# Patient Record
Sex: Male | Born: 1960
Health system: Southern US, Community
[De-identification: ages and names within clinical notes are randomized; demographics above are authoritative.]

## PROBLEM LIST (undated history)

## (undated) DIAGNOSIS — E079 Disorder of thyroid, unspecified: Secondary | ICD-10-CM

## (undated) DIAGNOSIS — R7309 Other abnormal glucose: Secondary | ICD-10-CM

---

## 2014-08-09 DIAGNOSIS — I10 Essential (primary) hypertension: Secondary | ICD-10-CM | POA: Insufficient documentation

## 2014-08-09 DIAGNOSIS — E559 Vitamin D deficiency, unspecified: Secondary | ICD-10-CM | POA: Insufficient documentation

## 2016-08-19 DIAGNOSIS — J301 Allergic rhinitis due to pollen: Secondary | ICD-10-CM | POA: Insufficient documentation

## 2017-05-28 DIAGNOSIS — M722 Plantar fascial fibromatosis: Secondary | ICD-10-CM | POA: Insufficient documentation

## 2018-11-07 ENCOUNTER — Encounter: Payer: Self-pay | Admitting: Emergency Medicine

## 2018-11-07 ENCOUNTER — Ambulatory Visit (INDEPENDENT_AMBULATORY_CARE_PROVIDER_SITE_OTHER): Payer: BLUE CROSS/BLUE SHIELD

## 2018-11-07 ENCOUNTER — Other Ambulatory Visit: Payer: Self-pay

## 2018-11-07 ENCOUNTER — Ambulatory Visit
Admission: EM | Admit: 2018-11-07 | Discharge: 2018-11-07 | Disposition: A | Discharge status: Home / Self Care | Payer: BLUE CROSS/BLUE SHIELD | Attending: Family Medicine | Admitting: Family Medicine

## 2018-11-07 DIAGNOSIS — S92355A Nondisplaced fracture of fifth metatarsal bone, left foot, initial encounter for closed fracture: Secondary | ICD-10-CM | POA: Diagnosis not present

## 2018-11-07 DIAGNOSIS — W010XXA Fall on same level from slipping, tripping and stumbling without subsequent striking against object, initial encounter: Secondary | ICD-10-CM

## 2018-11-07 HISTORY — DX: Disorder of thyroid, unspecified: E07.9

## 2018-11-07 NOTE — ED Provider Notes (Addendum)
[facility]    CSN: [id number] Arrival date & time: 11/07/18  1404     History   Chief Complaint Chief Complaint  Patient presents with  . Foot Pain    APPOINTMENT    HPI Tony Finley is a 57 y.o. male.   57 yo male with a c/o left foot pain to the outside (lateral) aspect of the foot after tripping and falling today while blowing leaves.   The history is provided by the patient.    Past Medical History:  Diagnosis Date  . Thyroid disease     There are no active problems to display for this patient.   History reviewed. No pertinent surgical history.     Home Medications    Prior to Admission medications   Medication Sig Start Date End Date Taking? Authorizing Provider  fexofenadine (ALLEGRA) 180 MG tablet Take by mouth.   Yes [provider]  levothyroxine (SYNTHROID) 112 MCG tablet Synthroid 112 mcg tablet 10/19/18  Yes [provider]    Family History History reviewed. No pertinent family history.  Social History Social History   Tobacco Use  . Smoking status: Never Smoker  . Smokeless tobacco: Never Used  Substance Use Topics  . Alcohol use: Not Currently  . Drug use: Never     Allergies   Codeine   Review of Systems Review of Systems   Physical Exam Triage Vital Signs ED Triage Vitals  Enc Vitals Group     BP 11/07/18 1445 (!) 154/81     Pulse Rate 11/07/18 1445 68     Resp 11/07/18 1445 16     Temp 11/07/18 1445 98.9 F (37.2 C)     Temp Source 11/07/18 1445 Oral     SpO2 11/07/18 1445 100 %     Weight 11/07/18 1443 215 lb (97.5 kg)     Height 11/07/18 1443 5\' 10"  (1.778 m)     Head Circumference --      Peak Flow --      Pain Score 11/07/18 1442 9     Pain Loc --      Pain Edu? --      Excl. in GC? --    No data found.  Updated Vital Signs BP (!) 154/81 (BP Location: Left Arm)   Pulse 68   Temp 98.9 F (37.2 C) (Oral)   Resp 16   Ht 5\' 10"  (1.778 m)   Wt 97.5 kg   SpO2 100%   BMI  30.85 kg/m   Visual Acuity Right Eye Distance:   Left Eye Distance:   Bilateral Distance:    Right Eye Near:   Left Eye Near:    Bilateral Near:     Physical Exam  Constitutional: He appears well-developed and well-nourished. No distress.  Musculoskeletal:       Left foot: There is tenderness, bony tenderness (over the 5th metatarsal) and swelling. There is normal range of motion, normal capillary refill, no crepitus and no laceration.  Skin: He is not diaphoretic.  Nursing note and vitals reviewed.    UC Treatments / Results  Labs (all labs ordered are listed, but only abnormal results are displayed) Labs Reviewed - No data to display  EKG None  Radiology Dg Foot Complete Left  Result Date: 11/07/2018 CLINICAL DATA:  Twisted LEFT foot today, pain at base of fifth metatarsal EXAM: LEFT FOOT - COMPLETE 3+ VIEW COMPARISON:  None FINDINGS: Osseous mineralization normal. Joint spaces preserved. Transverse lucency is  identified at the proximal LEFT fifth metatarsal diaphysis with mild adjacent sclerosis and ill-defined margins, compatible with a subacute healing fracture/stress fracture. No additional fracture, dislocation, or bone destruction. IMPRESSION: Subacute fracture/stress fracture at proximal LEFT fifth metatarsal. Electronically Signed   By: Ulyses SouthwardMark  Boles M.D.   On: 11/07/2018 15:51    Procedures Procedures (including critical care time)  Medications Ordered in UC Medications - No data to display  Initial Impression / Assessment and Plan / UC Course  I have reviewed the triage vital signs and the nursing notes.  Pertinent labs & imaging results that were available during my care of the patient were reviewed by me and considered in my medical decision making (see chart for details).      Final Clinical Impressions(s) / UC Diagnoses   Final diagnoses:  Closed nondisplaced fracture of fifth metatarsal bone of left foot, initial encounter     Discharge  Instructions     Follow up with orthopedist this week    ED Prescriptions    None     1. x-ray results and diagnosis reviewed with patient 2. Immobilized with cast boot and crutches for non-weight bearing 3. offered rx for pain medication, however patient declined 4. Recommend follow up with orthopedist this week 5. Follow-up prn  Controlled Substance Prescriptions [place] Controlled Substance Registry consulted? Not Applicable   Payton Mccallumonty, Aaleigha Bozza, MD 11/07/18 1652    Payton Mccallumonty, Shannelle Alguire, MD 11/07/18 [phone]

## 2018-11-07 NOTE — ED Triage Notes (Signed)
Patient states that he tripped outside today while blowing leaves off the side walk today  Patient c/o pain on the out side of his left foot.

## 2018-11-07 NOTE — Discharge Instructions (Signed)
Follow up with orthopedist this week °

## 2018-12-28 DIAGNOSIS — S92309A Fracture of unspecified metatarsal bone(s), unspecified foot, initial encounter for closed fracture: Secondary | ICD-10-CM | POA: Insufficient documentation

## 2021-03-05 DIAGNOSIS — M7660 Achilles tendinitis, unspecified leg: Secondary | ICD-10-CM | POA: Insufficient documentation

## 2022-01-02 ENCOUNTER — Other Ambulatory Visit: Payer: Self-pay

## 2022-01-02 ENCOUNTER — Ambulatory Visit: Payer: Self-pay

## 2022-01-02 ENCOUNTER — Ambulatory Visit
Admission: EM | Admit: 2022-01-02 | Discharge: 2022-01-02 | Disposition: A | Discharge status: Home / Self Care | Payer: BLUE CROSS/BLUE SHIELD | Attending: Emergency Medicine | Admitting: Emergency Medicine

## 2022-01-02 DIAGNOSIS — J0100 Acute maxillary sinusitis, unspecified: Secondary | ICD-10-CM

## 2022-01-02 HISTORY — DX: Other abnormal glucose: R73.09

## 2022-01-02 MED ORDER — AMOXICILLIN-POT CLAVULANATE 875-125 MG PO TABS: 1.0000 | ORAL_TABLET | Freq: Two times a day (BID) | ORAL | 0 refills | Status: DC

## 2022-01-02 NOTE — ED Provider Notes (Signed)
HPI  SUBJECTIVE:  Tony Finley is a 61 y.o. male who presents with nasal congestion, sinus pain and pressure, chest congestion and a cough productive of greenish-gray sputum for over a week.  States that he was getting better, but then got worse several days ago.  No rhinorrhea, fevers, facial swelling, upper dental pain.  He has postnasal drip worse at night.  No wheezing, shortness of breath.  No COVID or flu exposure.  He got a second dose of COVID-vaccine.  He did not yet get this years flu vaccine.  No antibiotics in the past month.  No antipyretic in the past 6 hours.  No allergy symptoms.  He has tried Mucinex, NyQuil and Flonase once.  The Mucinex helped.  No aggravating factors.  He has a past medical history of hypertension.  He has no other medical problems.  PMD: [facility]    Past Medical History:  Diagnosis Date   Elevated glucose level    Thyroid disease     History reviewed. No pertinent surgical history.  History reviewed. No pertinent family history.  Social History   Tobacco Use   Smoking status: Never   Smokeless tobacco: Never  Vaping Use   Vaping Use: Never used  Substance Use Topics   Alcohol use: Not Currently   Drug use: Never    No current facility-administered medications for this encounter.  Current Outpatient Medications:    amoxicillin-clavulanate (AUGMENTIN) 875-125 MG tablet, Take 1 tablet by mouth 2 (two) times daily. X 7 days, Disp: 14 tablet, Rfl: 0   levothyroxine (SYNTHROID) 112 MCG tablet, Synthroid 112 mcg tablet, Disp: , Rfl:    losartan (COZAAR) 25 MG tablet, Take 25 mg by mouth daily., Disp: , Rfl:    OZEMPIC, 1 MG/DOSE, 4 MG/3ML SOPN, Inject into the skin., Disp: , Rfl:    Vitamin D, Ergocalciferol, (DRISDOL) 1.25 MG (50000 UNIT) CAPS capsule, Take by mouth., Disp: , Rfl:    pantoprazole (PROTONIX) 40 MG tablet, Take 40 mg by mouth 2 (two) times daily., Disp: , Rfl:   Allergies  Allergen Reactions   Codeine Other (See  Comments)    Hyper (but sometimes does not cause this)     ROS  As noted in HPI.   Physical Exam  BP 118/76 (BP Location: Left Arm)    Pulse 76    Temp 98.9 F (37.2 C) (Oral)    Resp 18    Ht 5\' 10"  (1.778 m)    Wt 100.2 kg    SpO2 98%    BMI 31.71 kg/m   Constitutional: Well developed, well nourished, no acute distress Eyes:  EOMI, conjunctiva normal bilaterally HENT: Normocephalic, atraumatic,mucus membranes moist.  Purulent nasal congestion.  Positive left maxillary sinus tenderness.  Positive postnasal drip. Respiratory: Normal inspiratory effort lungs clear bilaterally Cardiovascular: Normal rate, regular rhythm, no murmurs, rubs, gallop GI: nondistended skin: No rash, skin intact Musculoskeletal: no deformities Neurologic: Alert & oriented x 3, no focal neuro deficits Psychiatric: Speech and behavior appropriate   ED Course   Medications - No data to display  No orders of the defined types were placed in this encounter.   No results found for this or any previous visit (from the past 24 hour(s)). No results found.  ED Clinical Impression  1. Acute non-recurrent maxillary sinusitis      ED Assessment/Plan  Presentation consistent with a URI that has turned into a secondary bacterial sinusitis.  Will send home with Augmentin, Flonase, continue Mucinex, start  saline nasal irrigation.  He declined cough syrup.  He states that he does not need a prescription for Flonase.  Follow-up with PMD as needed.  Did not check for COVID or flu because he is out of the treatment window for both.  Discussed MDM, treatment plan, and plan for follow-up with patient. . patient agrees with plan.   Meds ordered this encounter  Medications   amoxicillin-clavulanate (AUGMENTIN) 875-125 MG tablet    Sig: Take 1 tablet by mouth 2 (two) times daily. X 7 days    Dispense:  14 tablet    Refill:  0      *This clinic note was created using Scientist, clinical (histocompatibility and immunogenetics). Therefore, there  may be occasional mistakes despite careful proofreading.  ?    Domenick Gong, MD 01/02/22 1652

## 2022-01-02 NOTE — Discharge Instructions (Addendum)
Finish the Augmentin, even if you feel better, start Flonase, continue Mucinex, start saline nasal irrigation with a NeilMed sinus rinse and distilled water as often as you want.

## 2022-01-02 NOTE — ED Triage Notes (Signed)
Patient is here for "Congestion" that started last week. Then started resolving. Now with "Cough" and increased "productivity" with mucous now. No fever. No "runny nose". No fever. No sob. No new/unexplained rash. No COVID19 testing at home.

## 2022-01-30 DIAGNOSIS — E6609 Other obesity due to excess calories: Secondary | ICD-10-CM | POA: Insufficient documentation

## 2022-02-03 DIAGNOSIS — N289 Disorder of kidney and ureter, unspecified: Secondary | ICD-10-CM | POA: Insufficient documentation

## 2022-02-07 ENCOUNTER — Other Ambulatory Visit: Payer: Self-pay

## 2022-02-07 DIAGNOSIS — Z01810 Encounter for preprocedural cardiovascular examination: Secondary | ICD-10-CM

## 2022-02-07 DIAGNOSIS — Z8342 Family history of familial hypercholesterolemia: Secondary | ICD-10-CM

## 2022-02-07 NOTE — Progress Notes (Unsigned)
Patient wife "Collie Siad" calling to obtain order for discussed ct calcium score.     She states Gollan also agreed to order for her husband Coriano) .  His information was sent in a staff msg as he is not established with cvd .   MyChart message sent to Mr. & Mrs. Lasota in their own separate MyChart account

## 2022-02-26 ENCOUNTER — Other Ambulatory Visit: Payer: Self-pay

## 2022-02-26 ENCOUNTER — Ambulatory Visit
Admission: RE | Admit: 2022-02-26 | Discharge: 2022-02-26 | Disposition: A | Discharge status: Home / Self Care | Payer: BLUE CROSS/BLUE SHIELD | Source: Ambulatory Visit | Attending: Cardiovascular Disease | Admitting: Cardiovascular Disease

## 2022-02-26 DIAGNOSIS — Z01810 Encounter for preprocedural cardiovascular examination: Secondary | ICD-10-CM | POA: Insufficient documentation

## 2022-02-26 DIAGNOSIS — Z8342 Family history of familial hypercholesterolemia: Secondary | ICD-10-CM | POA: Insufficient documentation

## 2022-08-04 DIAGNOSIS — R7303 Prediabetes: Secondary | ICD-10-CM | POA: Insufficient documentation

## 2022-08-29 IMAGING — CT CT CARDIAC CORONARY ARTERY CALCIUM SCORE
3 series · 14 of 20 positions shown, 16 images · non-contrast
Comparison: None.
COMPARISON: None.

Addendum:
EXAM:
OVER-READ INTERPRETATION  CT CHEST

The following report is an over-read performed by radiologist Dr.
Boon Keong Siew Mooi [REDACTED] on 02/26/2022. This
over-read does not include interpretation of cardiac or coronary
anatomy or pathology. The coronary calcium score/coronary CTA
interpretation by the cardiologist is attached.
CLINICAL DATA: Risk stratification
Coronary Calcium Score
TECHNIQUE: The patient was scanned on a Siemens Somatom go.Top Scanner. Axial
non-contrast 3 mm slices were carried out through the heart. The
data set was analyzed on a dedicated work station and scored using
the Agatson method.

[Series 2: sa36 calcium scoring 3.00 · axial · 0.41mm/px · z∈[-1164,-1083]mm · 4 of 46 slices shown]
[im 10/46  vessel]
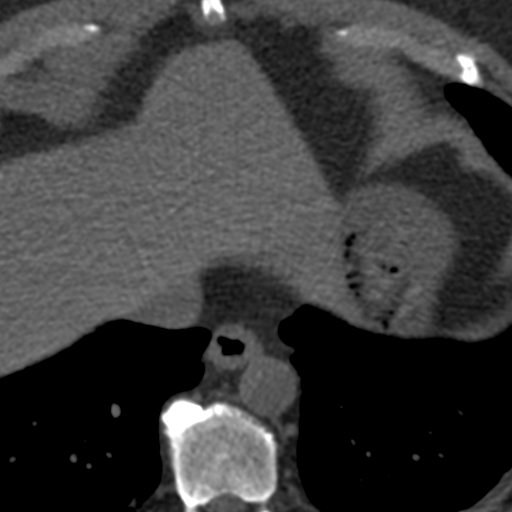
[im 19/46  vessel]
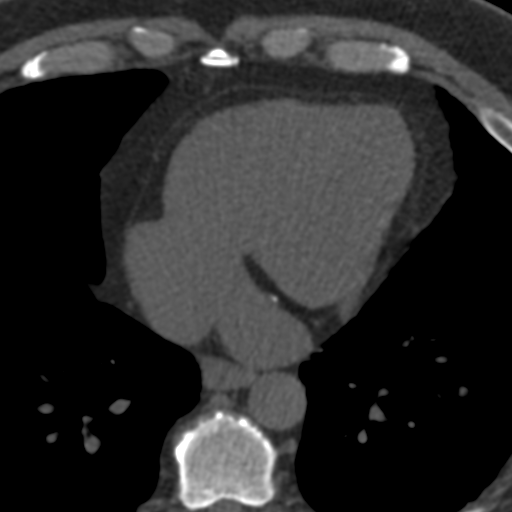
[im 28/46  vessel]
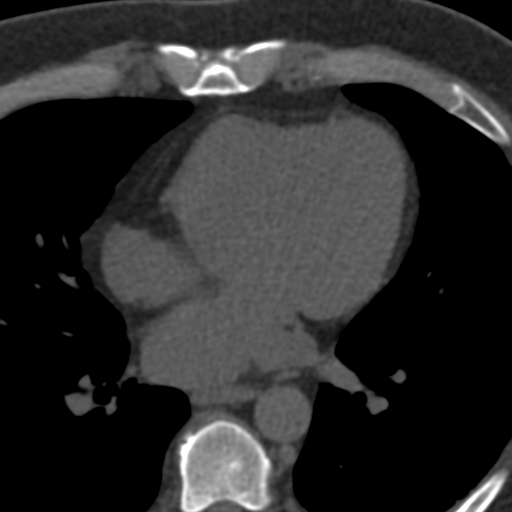
[im 37/46  vessel]
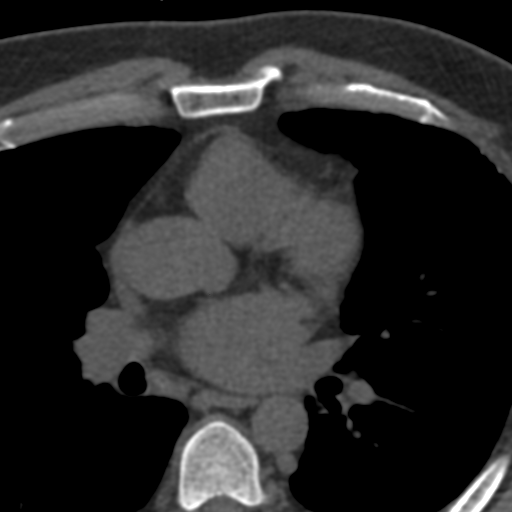

[Series 5: full fov st calcium scoring 3.00 · axial · 0.71mm/px · z∈[-1170,-1080]mm · 5 of 46 slices shown, 7 images]
[im 8/46  vessel]
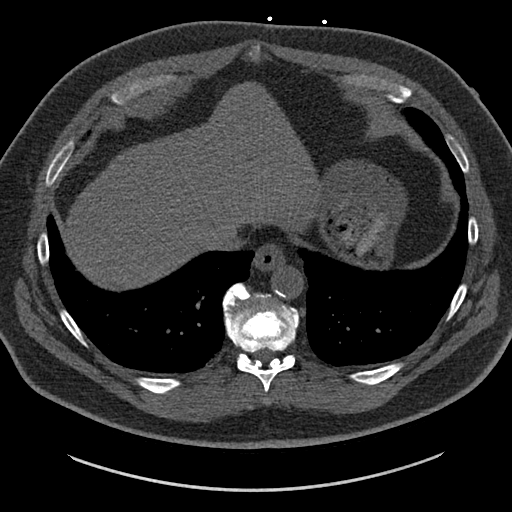
[im 8/46  lung]
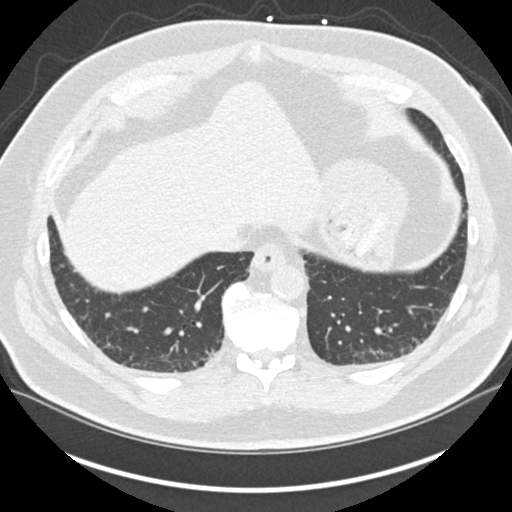
[im 16/46  vessel]
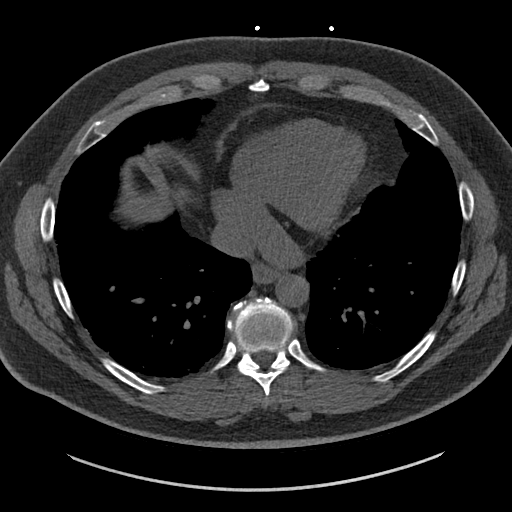
[im 23/46  vessel]
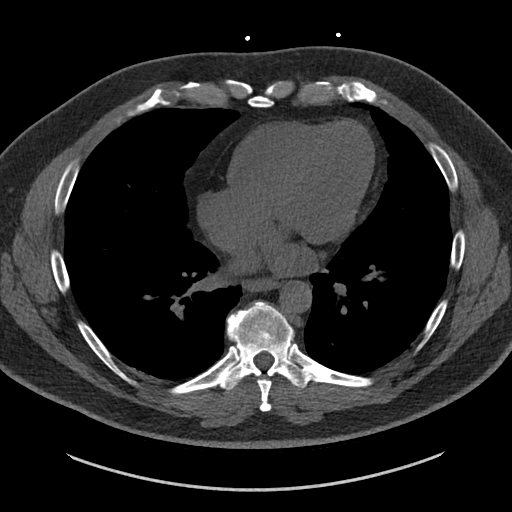
[im 31/46  vessel]
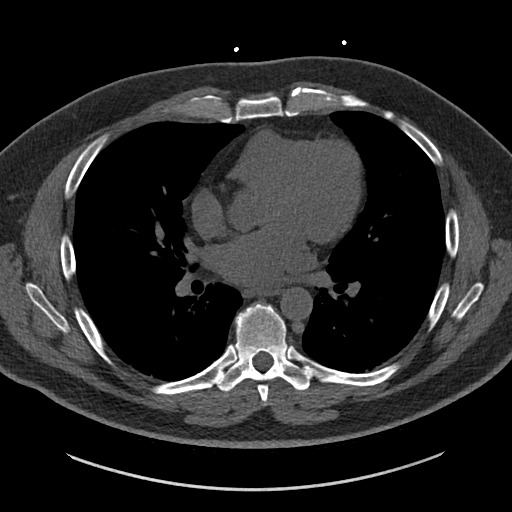
[im 38/46  vessel]
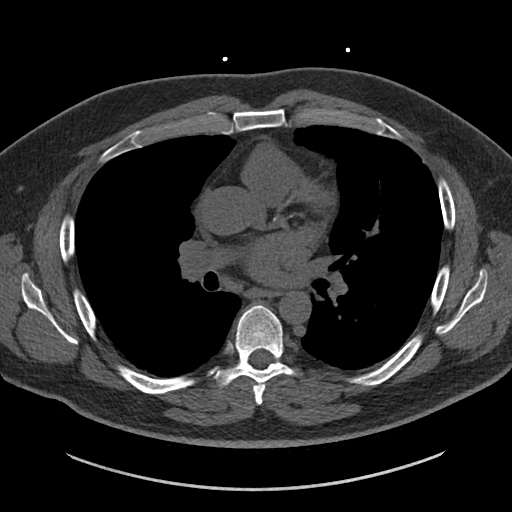
[im 38/46  lung]
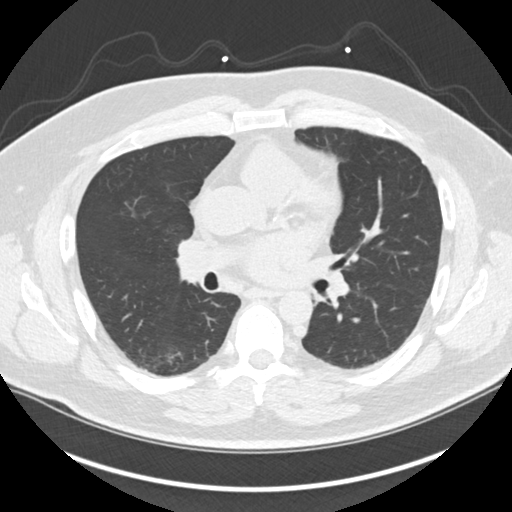

[Series 10: full fov lungs calcium scoring 3.00 ax · axial · 0.71mm/px · z∈[-1170,-1080]mm · 5 of 46 slices shown]
[im 8/46  vessel]
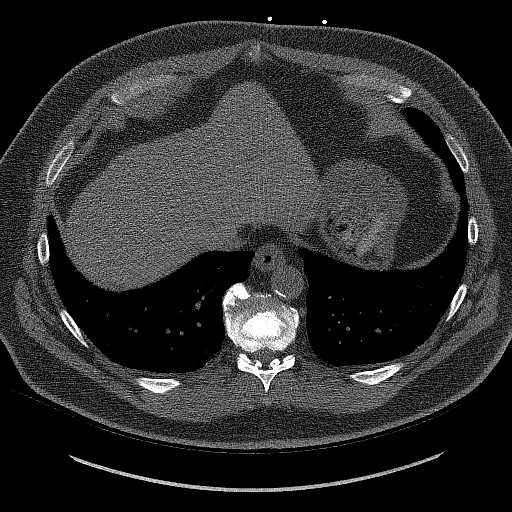
[im 16/46  vessel]
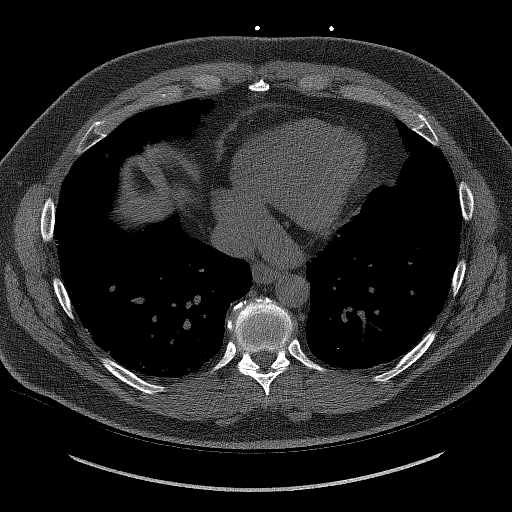
[im 23/46  vessel]
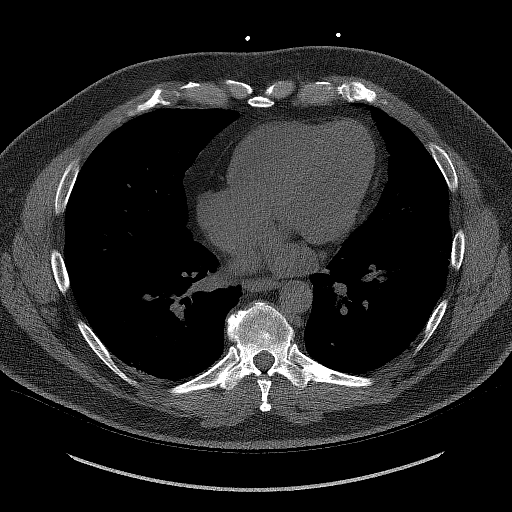
[im 31/46  vessel]
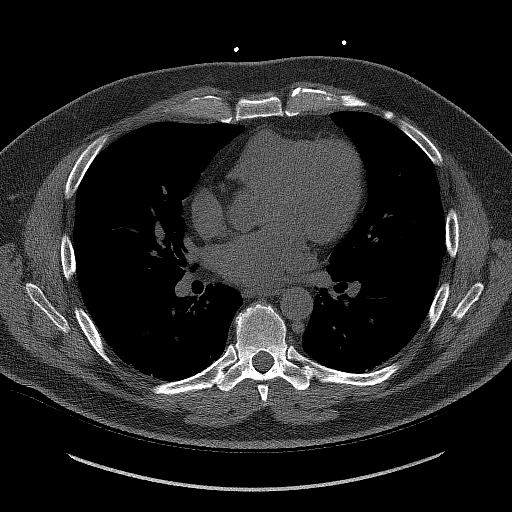
[im 38/46  vessel]
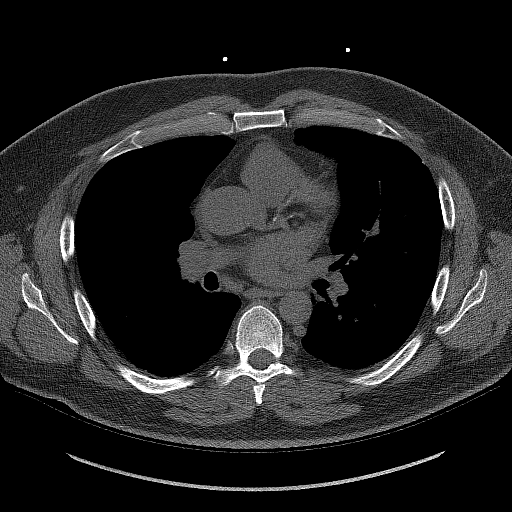

[14 of 20 positions shown; findings below may reference images not displayed]

FINDINGS: Vascular: Enlarged pulmonic trunk.

Mediastinum/Nodes: None.

Lungs/Pleura: Smudgy subpleural lymph node along the minor fissure.
Mild basilar subpleural reticular densities and ground-glass. There
may be associated peribronchovascular nodularity. No pleural fluid.

Upper Abdomen: None.

Musculoskeletal: Degenerative changes in the spine.
IMPRESSION: Mild basilar subpleural reticular densities and ground-glass with
possible associated peribronchovascular nodularity. Findings can be
seen with interstitial lung disease. High-resolution chest CT
without contrast could be performed in further evaluation, as
clinically indicated.
FINDINGS: Non-cardiac: See separate report from [REDACTED].

Ascending Aorta: Normal size

Pericardium: Normal

Coronary arteries: Normal origin of left and right coronary
arteries. Distribution of arterial calcifications if present, as
noted below;

LM 0

LAD

LCx

RCA 0

Total

IMPRESSION AND RECOMMENDATION:
1. Coronary calcium score of 11.3. This was 43rd percentile for age
and sex matched control.

2. CAC 1-99 in LAD, LCx. THI MOHLER1/N2.

3. Continue heart healthy lifestyle and risk factor modification.

*** End of Addendum ***
EXAM:
OVER-READ INTERPRETATION  CT CHEST

The following report is an over-read performed by radiologist Dr.
Boon Keong Siew Mooi [REDACTED] on 02/26/2022. This
over-read does not include interpretation of cardiac or coronary
anatomy or pathology. The coronary calcium score/coronary CTA
interpretation by the cardiologist is attached.
FINDINGS: Vascular: Enlarged pulmonic trunk.

Mediastinum/Nodes: None.

Lungs/Pleura: Smudgy subpleural lymph node along the minor fissure.
Mild basilar subpleural reticular densities and ground-glass. There
may be associated peribronchovascular nodularity. No pleural fluid.

Upper Abdomen: None.

Musculoskeletal: Degenerative changes in the spine.
IMPRESSION: Mild basilar subpleural reticular densities and ground-glass with
possible associated peribronchovascular nodularity. Findings can be
seen with interstitial lung disease. High-resolution chest CT
without contrast could be performed in further evaluation, as
clinically indicated.

## 2022-10-29 ENCOUNTER — Ambulatory Visit
Admission: RE | Admit: 2022-10-29 | Discharge: 2022-10-29 | Disposition: A | Discharge status: Home / Self Care | Payer: BLUE CROSS/BLUE SHIELD | Source: Ambulatory Visit

## 2022-10-29 VITALS — BP 137/91 | HR 76 | Temp 98.4°F | Resp 17

## 2022-10-29 DIAGNOSIS — B9689 Other specified bacterial agents as the cause of diseases classified elsewhere: Secondary | ICD-10-CM | POA: Diagnosis not present

## 2022-10-29 DIAGNOSIS — J019 Acute sinusitis, unspecified: Secondary | ICD-10-CM | POA: Diagnosis not present

## 2022-10-29 DIAGNOSIS — J209 Acute bronchitis, unspecified: Secondary | ICD-10-CM | POA: Diagnosis not present

## 2022-10-29 DIAGNOSIS — E039 Hypothyroidism, unspecified: Secondary | ICD-10-CM | POA: Insufficient documentation

## 2022-10-29 MED ORDER — PREDNISONE 20 MG PO TABS: ORAL_TABLET | ORAL | 0 refills | Status: AC

## 2022-10-29 MED ORDER — AMOXICILLIN-POT CLAVULANATE 875-125 MG PO TABS: 1.0000 | ORAL_TABLET | Freq: Two times a day (BID) | ORAL | 0 refills | Status: AC

## 2022-10-29 MED ORDER — HYDROCOD POLI-CHLORPHE POLI ER 10-8 MG/5ML PO SUER: 5.0000 mL | Freq: Two times a day (BID) | ORAL | 0 refills | Status: AC | PRN

## 2022-10-29 MED ORDER — BENZONATATE 100 MG PO CAPS: ORAL_CAPSULE | ORAL | 0 refills | Status: AC

## 2022-10-29 NOTE — ED Provider Notes (Signed)
[facility]    CSN: [id number] Arrival date & time: 10/29/22  1055      History   Chief Complaint Chief Complaint  Patient presents with   Cough    Bad cold since last Wednesday. Can't sleep because of coughing. - Entered by patient   Facial Pain    HPI Tony Finley is a 61 y.o. male.    Cough   Presents to UC with symptoms x 1 week, including sinus pressure/pain, postnasal drip with associated cough.  He endorses some recent overseas travel where he thinks he picked up the illness.  PMH including past use of ozempic for "insulin resistance" but denies hx of DM2 or treatment for it.  Past Medical History:  Diagnosis Date   Elevated glucose level    Thyroid disease     Patient Active Problem List   Diagnosis Date Noted   Acquired hypothyroidism 10/29/2022   Borderline diabetes mellitus 08/04/2022   Renal insufficiency 02/03/2022   Class 1 obesity due to excess calories with serious comorbidity and body mass index (BMI) of 34.0 to 34.9 in adult 01/30/2022   Achilles tendinitis 03/05/2021   Closed fracture of metatarsal bone 12/28/2018   Plantar fasciitis of right foot 05/28/2017   Chronic seasonal allergic rhinitis due to pollen 08/19/2016   Benign essential hypertension 08/09/2014   Vitamin D deficiency 08/09/2014    History reviewed. No pertinent surgical history.     Home Medications    Prior to Admission medications   Medication Sig Start Date End Date Taking? Authorizing Provider  Calcium Carb-Cholecalciferol 600-10 MG-MCG TABS Take by mouth. 01/30/22  Yes [provider]  amoxicillin-clavulanate (AUGMENTIN) 875-125 MG tablet Take 1 tablet by mouth 2 (two) times daily. X 7 days 01/02/22   Domenick Gong, MD  cyanocobalamin (VITAMIN B12) 1000 MCG tablet Take by mouth.    [provider]  levothyroxine (SYNTHROID) 112 MCG tablet Synthroid 112 mcg tablet 10/19/18   [provider]  losartan (COZAAR) 25 MG  tablet Take 25 mg by mouth daily. 11/20/21   [provider]  OZEMPIC, 1 MG/DOSE, 4 MG/3ML SOPN Inject into the skin. 10/30/21   [provider]  pantoprazole (PROTONIX) 40 MG tablet Take 40 mg by mouth 2 (two) times daily. 11/23/21   [provider]  Vitamin D, Ergocalciferol, (DRISDOL) 1.25 MG (50000 UNIT) CAPS capsule Take by mouth. 12/12/21   [provider]    Family History History reviewed. No pertinent family history.  Social History Social History   Tobacco Use   Smoking status: Never   Smokeless tobacco: Never  Vaping Use   Vaping Use: Never used  Substance Use Topics   Alcohol use: Not Currently   Drug use: Never     Allergies   Codeine   Review of Systems Review of Systems  Respiratory:  Positive for cough.      Physical Exam Triage Vital Signs ED Triage Vitals [10/29/22 1103]  Enc Vitals Group     BP (!) 137/91     Pulse Rate 76     Resp 17     Temp 98.4 F (36.9 C)     Temp src      SpO2 96 %     Weight      Height      Head Circumference      Peak Flow      Pain Score 6     Pain Loc      Pain  Edu?      Excl. in GC?    No data found.  Updated Vital Signs BP (!) 137/91   Pulse 76   Temp 98.4 F (36.9 C)   Resp 17   SpO2 96%   Visual Acuity Right Eye Distance:   Left Eye Distance:   Bilateral Distance:    Right Eye Near:   Left Eye Near:    Bilateral Near:     Physical Exam Vitals reviewed.  Constitutional:      Appearance: Normal appearance.  Eyes:     Conjunctiva/sclera: Conjunctivae normal.     Pupils: Pupils are equal, round, and reactive to light.  Cardiovascular:     Rate and Rhythm: Normal rate and regular rhythm.     Pulses: Normal pulses.     Heart sounds: Normal heart sounds.  Pulmonary:     Effort: Pulmonary effort is normal.     Breath sounds: Normal breath sounds. No wheezing.  Skin:    General: Skin is warm and dry.  Neurological:     General: No focal deficit present.      Mental Status: He is alert and oriented to person, place, and time.  Psychiatric:        Mood and Affect: Mood normal.        Behavior: Behavior normal.      UC Treatments / Results  Labs (all labs ordered are listed, but only abnormal results are displayed) Labs Reviewed - No data to display  EKG   Radiology No results found.  Procedures Procedures (including critical care time)  Medications Ordered in UC Medications - No data to display  Initial Impression / Assessment and Plan / UC Course  I have reviewed the triage vital signs and the nursing notes.  Pertinent labs & imaging results that were available during my care of the patient were reviewed by me and considered in my medical decision making (see chart for details).   Patient is afebrile here without recent antipyretics. Satting well on room air. Overall is ill appearing, though well hydrated, without respiratory distress. Pulmonary exam is unremarkable.  Lungs CTAB.  Frontal and maxillary sinus tenderness is present.  Constant harsh, dry cough throughout the exam.  Given symptoms greater than 1 week, will treat for onset of secondary bacterial sinusitis following likely viral URI.  Prescribing Augmentin twice daily x10 days for sinusitis along with prednisone taper for relief of sinus inflammation as well as bronchitis.  Will provide cough relief using benzonatate during the day and Tussionex at night.  Final Clinical Impressions(s) / UC Diagnoses   Final diagnoses:  None   Discharge Instructions   None    ED Prescriptions   None    PDMP not reviewed this encounter.   Charma Igo, [place] 10/29/22 1118

## 2022-10-29 NOTE — Discharge Instructions (Signed)
Follow up here or with your primary care provider if your symptoms are worsening or not improving with treatment.     

## 2022-10-29 NOTE — ED Triage Notes (Signed)
Pt. Presents to UC w/ c/o sinus pressure and postnasal drip that induces a cough for the past week. Pt. States he did some recent travel out of the country that he thinks provoked his symptoms.

## 2023-02-06 ENCOUNTER — Ambulatory Visit
Admission: RE | Admit: 2023-02-06 | Discharge: 2023-02-06 | Disposition: A | Discharge status: Home / Self Care | Payer: BLUE CROSS/BLUE SHIELD | Source: Ambulatory Visit

## 2023-02-06 VITALS — BP 137/81 | HR 94 | Temp 99.4°F | Resp 18

## 2023-02-06 DIAGNOSIS — U071 COVID-19: Secondary | ICD-10-CM | POA: Diagnosis not present

## 2023-02-06 MED ORDER — PROMETHAZINE-DM 6.25-15 MG/5ML PO SYRP: 5.0000 mL | ORAL_SOLUTION | Freq: Four times a day (QID) | ORAL | 0 refills | Status: AC | PRN

## 2023-02-06 NOTE — ED Provider Notes (Signed)
[facility]    CSN: [id number] Arrival date & time: 02/06/23  1018      History   Chief Complaint Chief Complaint  Patient presents with   Cough    Cough and fever, feeling bad and tested positive for Covid-19 on 2/22 - Entered by patient    HPI Tony Finley is a 62 y.o. male.  Patient presents with low-grade elevated temperature, fatigue, body aches, cough since 02/03/2023.Marland Kitchen  He tested positive for COVID at home on 02/04/2023.  Tmax 100.  He reports the cough is keeping him awake at night.  Treatment attempted with Nyquil.  No OTC medications taken today.  He denies chest pain, shortness of breath, vomiting, diarrhea, or other symptoms.  His medical history includes thyroid disease, borderline diabetes, hypertension.  The history is provided by the patient and medical records.    Past Medical History:  Diagnosis Date   Elevated glucose level    Thyroid disease     Patient Active Problem List   Diagnosis Date Noted   Acquired hypothyroidism 10/29/2022   Borderline diabetes mellitus 08/04/2022   Renal insufficiency 02/03/2022   Class 1 obesity due to excess calories with serious comorbidity and body mass index (BMI) of 34.0 to 34.9 in adult 01/30/2022   Achilles tendinitis 03/05/2021   Closed fracture of metatarsal bone 12/28/2018   Plantar fasciitis of right foot 05/28/2017   Chronic seasonal allergic rhinitis due to pollen 08/19/2016   Benign essential hypertension 08/09/2014   Vitamin D deficiency 08/09/2014    History reviewed. No pertinent surgical history.     Home Medications    Prior to Admission medications   Medication Sig Start Date End Date Taking? Authorizing Provider  losartan (COZAAR) 25 MG tablet Take 1 tablet by mouth daily. 11/17/22  Yes [provider]  promethazine-dextromethorphan (PROMETHAZINE-DM) 6.25-15 MG/5ML syrup Take 5 mLs by mouth 4 (four) times daily as needed. 02/06/23  Yes Sharion Balloon, NP  benzonatate  (TESSALON) 100 MG capsule Take 1-2 tablets 3 times a day as needed for cough Patient not taking: Reported on 02/06/2023 10/29/22   Immordino, Annie Main, FNP  Calcium Carb-Cholecalciferol 600-10 MG-MCG TABS Take by mouth. 01/30/22   [provider]  cyanocobalamin (VITAMIN B12) 1000 MCG tablet Take by mouth.    [provider]  levothyroxine (SYNTHROID) 112 MCG tablet Synthroid 112 mcg tablet 10/19/18   [provider]  losartan (COZAAR) 25 MG tablet Take 25 mg by mouth daily. 11/20/21   [provider]  OZEMPIC, 1 MG/DOSE, 4 MG/3ML SOPN Inject into the skin. Patient not taking: Reported on 02/06/2023 10/30/21   [provider]  pantoprazole (PROTONIX) 40 MG tablet Take 40 mg by mouth 2 (two) times daily. Patient not taking: Reported on 02/06/2023 11/23/21   [provider]  Vitamin D, Ergocalciferol, (DRISDOL) 1.25 MG (50000 UNIT) CAPS capsule Take by mouth. 12/12/21   [provider]    Family History No family history on file.  Social History Social History   Tobacco Use   Smoking status: Never   Smokeless tobacco: Never  Vaping Use   Vaping Use: Never used  Substance Use Topics   Alcohol use: Not Currently   Drug use: Never     Allergies   Codeine   Review of Systems Review of Systems  Constitutional:  Positive for fatigue. Negative for chills and fever.  HENT:  Negative for ear pain and sore throat.   Respiratory:  Positive for cough. Negative  for shortness of breath.   Cardiovascular:  Negative for chest pain and palpitations.  Gastrointestinal:  Negative for diarrhea and vomiting.  Skin:  Negative for rash.  All other systems reviewed and are negative.    Physical Exam Triage Vital Signs ED Triage Vitals  Enc Vitals Group     BP      Pulse      Resp      Temp      Temp src      SpO2      Weight      Height      Head Circumference      Peak Flow      Pain Score      Pain Loc      Pain Edu?       Excl. in [place]?    No data found.  Updated Vital Signs BP 137/81   Pulse 94   Temp 99.4 F (37.4 C)   Resp 18   SpO2 96%   Visual Acuity Right Eye Distance:   Left Eye Distance:   Bilateral Distance:    Right Eye Near:   Left Eye Near:    Bilateral Near:     Physical Exam Vitals and nursing note reviewed.  Constitutional:      General: He is not in acute distress.    Appearance: He is well-developed. He is obese. He is not ill-appearing.  HENT:     Right Ear: Tympanic membrane normal.     Left Ear: Tympanic membrane normal.     Nose: Nose normal.     Mouth/Throat:     Mouth: Mucous membranes are moist.     Pharynx: Oropharynx is clear.  Cardiovascular:     Rate and Rhythm: Normal rate and regular rhythm.     Heart sounds: Normal heart sounds.  Pulmonary:     Effort: Pulmonary effort is normal. No respiratory distress.     Breath sounds: Normal breath sounds.  Musculoskeletal:     Cervical back: Neck supple.  Skin:    General: Skin is warm and dry.  Neurological:     Mental Status: He is alert.  Psychiatric:        Mood and Affect: Mood normal.        Behavior: Behavior normal.      UC Treatments / Results  Labs (all labs ordered are listed, but only abnormal results are displayed) Labs Reviewed - No data to display  EKG   Radiology No results found.  Procedures Procedures (including critical care time)  Medications Ordered in UC Medications - No data to display  Initial Impression / Assessment and Plan / UC Course  I have reviewed the triage vital signs and the nursing notes.  Pertinent labs & imaging results that were available during my care of the patient were reviewed by me and considered in my medical decision making (see chart for details).    COVID-19.  Discussed treatment options.  Patient declines treatment with COVID antiviral.  He states he just needs cough medication to help him sleep at night as his cough is keeping him awake.   Treating with Promethazine DM.  Precautions for drowsiness with this medication discussed.  Education provided on COVID.  ED precautions given.  Instructed patient to follow up with her PCP if his symptoms are not improving.  He agrees to plan of care.    Final Clinical Impressions(s) / UC Diagnoses   Final diagnoses:  [id number]  Discharge Instructions      Take the promethazine DM as directed.  Do not drive, operate machinery, drink alcohol, or perform dangerous activities while taking this medication as it may cause drowsiness.  Follow up with your primary care provider if your symptoms are not improving.        ED Prescriptions     Medication Sig Dispense Auth. Provider   promethazine-dextromethorphan (PROMETHAZINE-DM) 6.25-15 MG/5ML syrup Take 5 mLs by mouth 4 (four) times daily as needed. 118 mL Sharion Balloon, NP      PDMP not reviewed this encounter.   Sharion Balloon, NP 02/06/23 1105

## 2023-02-06 NOTE — Discharge Instructions (Addendum)
Take the promethazine DM as directed.  Do not drive, operate machinery, drink alcohol, or perform dangerous activities while taking this medication as it may cause drowsiness.  Follow up with your primary care provider if your symptoms are not improving.

## 2023-02-06 NOTE — ED Triage Notes (Signed)
Patient to Urgent Care with complaints of generalized body aches, low energy, productive cough w/ clear mucus, low grade fevers.  Symptoms started Tuesday evening.   Reports testing positive for Covid Wednesday afternoon. Wife also covid positive.  Has been taking nyquil.

## 2023-08-05 ENCOUNTER — Ambulatory Visit: Payer: BLUE CROSS/BLUE SHIELD

## 2023-08-05 DIAGNOSIS — Z1211 Encounter for screening for malignant neoplasm of colon: Secondary | ICD-10-CM | POA: Diagnosis present

## 2023-08-05 DIAGNOSIS — K648 Other hemorrhoids: Secondary | ICD-10-CM | POA: Diagnosis not present

## 2023-08-05 DIAGNOSIS — D122 Benign neoplasm of ascending colon: Secondary | ICD-10-CM | POA: Diagnosis not present

## 2023-08-05 DIAGNOSIS — K635 Polyp of colon: Secondary | ICD-10-CM | POA: Diagnosis not present

## 2023-08-05 DIAGNOSIS — K621 Rectal polyp: Secondary | ICD-10-CM | POA: Diagnosis not present
